# Patient Record
Sex: Male | Born: 1950 | Smoking: Former smoker
Health system: Southern US, Community
[De-identification: ages and names within clinical notes are randomized; demographics above are authoritative.]

## PROBLEM LIST (undated history)

## (undated) DIAGNOSIS — R0602 Shortness of breath: Secondary | ICD-10-CM

## (undated) DIAGNOSIS — Z720 Tobacco use: Secondary | ICD-10-CM

## (undated) DIAGNOSIS — R55 Syncope and collapse: Secondary | ICD-10-CM

## (undated) DIAGNOSIS — R0789 Other chest pain: Secondary | ICD-10-CM

## (undated) DIAGNOSIS — I421 Obstructive hypertrophic cardiomyopathy: Secondary | ICD-10-CM

## (undated) HISTORY — DX: Shortness of breath: R06.02

## (undated) HISTORY — DX: Syncope and collapse: R55

## (undated) HISTORY — DX: Obstructive hypertrophic cardiomyopathy: I42.1

## (undated) HISTORY — DX: Other chest pain: R07.89

## (undated) HISTORY — DX: Tobacco use: Z72.0

---

## 2021-06-03 ENCOUNTER — Other Ambulatory Visit (HOSPITAL_COMMUNITY): Payer: Self-pay | Admitting: Cardiovascular Disease

## 2021-06-03 DIAGNOSIS — I422 Other hypertrophic cardiomyopathy: Secondary | ICD-10-CM

## 2021-07-03 ENCOUNTER — Other Ambulatory Visit (HOSPITAL_COMMUNITY): Payer: Self-pay | Admitting: *Deleted

## 2021-07-03 ENCOUNTER — Telehealth (HOSPITAL_COMMUNITY): Payer: Self-pay | Admitting: *Deleted

## 2021-07-03 DIAGNOSIS — Z01812 Encounter for preprocedural laboratory examination: Secondary | ICD-10-CM

## 2021-07-03 NOTE — Telephone Encounter (Signed)
Reaching out to patient to offer assistance regarding upcoming cardiac imaging study; pt verbalizes understanding of appt date/time, parking situation and where to check in, and verified current allergies; name and call back number provided for further questions should they arise ? ?Gordy Clement RN Navigator Cardiac Imaging ?[facility] ?[phone] office ?[phone] cell ? ?Patient denies any claustrophobia or metal. He will obtain labs prior to his appointment. ?

## 2021-07-06 ENCOUNTER — Other Ambulatory Visit (HOSPITAL_COMMUNITY)
Admission: RE | Admit: 2021-07-06 | Discharge: 2021-07-06 | Disposition: A | Discharge status: Home / Self Care | Payer: No Typology Code available for payment source | Source: Ambulatory Visit | Attending: Cardiovascular Disease | Admitting: Cardiovascular Disease

## 2021-07-06 DIAGNOSIS — Z01812 Encounter for preprocedural laboratory examination: Secondary | ICD-10-CM | POA: Diagnosis present

## 2021-07-06 LAB — CBC
HCT: 48.2 % (ref 39.0–52.0)
Hemoglobin: 16.9 g/dL (ref 13.0–17.0)
MCH: 31.5 pg (ref 26.0–34.0)
MCHC: 35.1 g/dL (ref 30.0–36.0)
MCV: 89.8 fL (ref 80.0–100.0)
Platelets: 228 10*3/uL (ref 150–400)
RBC: 5.37 MIL/uL (ref 4.22–5.81)
RDW: 12.1 % (ref 11.5–15.5)
WBC: 9.6 10*3/uL (ref 4.0–10.5)
nRBC: 0 % (ref 0.0–0.2)

## 2021-07-08 ENCOUNTER — Other Ambulatory Visit: Payer: Self-pay

## 2021-07-08 ENCOUNTER — Ambulatory Visit (HOSPITAL_COMMUNITY)
Admission: RE | Admit: 2021-07-08 | Discharge: 2021-07-08 | Disposition: A | Discharge status: Home / Self Care | Payer: No Typology Code available for payment source | Source: Ambulatory Visit | Attending: Cardiovascular Disease | Admitting: Cardiovascular Disease

## 2021-07-08 DIAGNOSIS — I422 Other hypertrophic cardiomyopathy: Secondary | ICD-10-CM

## 2021-07-08 MED ORDER — GADOBUTROL 1 MMOL/ML IV SOLN
8.0000 mL | Freq: Once | INTRAVENOUS | Status: AC | PRN
  Administered 2021-07-08: 8 mL via INTRAVENOUS

## 2021-09-02 ENCOUNTER — Telehealth: Payer: Self-pay

## 2021-09-02 NOTE — Telephone Encounter (Signed)
NOTES SCANNED TO REFERRRAL 

## 2021-11-15 NOTE — Progress Notes (Unsigned)
Cardiology Office Note:    Date:  11/17/2021   ID:  Roy Joseph, DOB [birth year], MRN [id number]  PCP:  Clinic, [facility] Providers Cardiologist:  Christell Constant, MD     Referring MD: Ulanda Edison, MD   CC: Discussed of HCM therapies Consulted for the evaluation of HCM at the behest of Dr. Baron Hamper  History of Present Illness:    Roy Joseph is a 71 y.o. male with a hx of oHCM (MRI 06/2021, Moderate AI, Mild Aortic dilation, mitral regurgitation, septal thickness 20 mm).  Tobacco abuse. First diagnosed in [place].  Has a daughter who he stays with (he predominantly stays in his [place] Niece).  He gets care through the [place].  Father had HCM. 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    Physically he feels great. 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 Notes variable fatigue, worse in the winter. Notes no palpitations Notes no CP. Notes rare dizziness with quick standing. Notes no cardiac syncope. Notable family events include Father with HCM. Daughter is a Systems analyst, she has been told to get checked. Notes profound fatigue with metoprolol: profound hypotension.  Past Medical History:  Diagnosis Date   Chest heaviness    Near syncope    Obstructive hypertrophic cardiomyopathy (HCC)    SOB (shortness of breath)    Tobacco user     History reviewed. No pertinent surgical history.  Current Medications: Current Meds  Medication Sig   sildenafil (VIAGRA) 100 MG tablet Take 100 mg by mouth daily as needed for erectile dysfunction.     Allergies:   Patient has no known allergies.   Social History   Socioeconomic History   Marital status: Unknown    Spouse name: Not on file   Number of children: Not on file   Years of education: Not on file   Highest  education level: Not on file  Occupational History   Not on file  Tobacco Use   Smoking status: Former    Types: Cigarettes   Smokeless tobacco: Never  Substance and Sexual Activity   Alcohol use: Yes   Drug use: Not on file   Sexual activity: Yes  Other Topics Concern   Not on file  Social History Narrative   Not on file   Social Determinants of Health   Financial Resource Strain: Not on file  Food Insecurity: Not on file  Transportation Needs: Not on file  Physical Activity: Not on file  Stress: Not on file  Social Connections: Not on file     Family History: The patient's family history includes Heart Problems in his father.  ROS:   Please see the history of present illness.     All other systems reviewed and are negative.  EKGs/Labs/Other Studies Reviewed:    The following studies were reviewed today:  EKG:  EKG is  ordered today.  The ekg ordered today demonstrates  11/17/21: SR rate 67 LBBB  CMR: Date 07/08/21 Results: There is severe inferoseptal hypertrophy, 20 mm, with systolic anterior motion of the mitral valve, moderate regurgitation, and small LGE burden. This is consistent with hypertrophic cardiomyopathy.   There is mild dilation of the aorta with moderate aortic regurgitation and mild LV dilation by indexed volume.  Echo  from OSH shows MR, AI and moderate LVH.  Unclear LVOT gradient.  Recent Labs: 07/06/2021: Hemoglobin 16.9; Platelets 228  Recent Lipid Panel No results found for: "CHOL", "TRIG", "HDL", "CHOLHDL", "VLDL", "LDLCALC", "LDLDIRECT"       Physical Exam:    VS:  BP 130/78   Pulse 67   Ht 5\' 9"  (1.753 m)   Wt 175 lb (79.4 kg)   SpO2 97%   BMI 25.84 kg/m     Wt Readings from Last 3 Encounters:  11/17/21 175 lb (79.4 kg)   Gen: No distress  Neck: No JVD Ears: no 11/19/21 Sign Cardiac: No Rubs or Gallops, harsh systolic murmur with standing and handgrip, RRR + 2 radial pulses Respiratory: Clear to auscultation bilaterally,  normal effort, normal  respiratory rate GI: Soft, nontender, non-distended  MS: No  edema;  moves all extremities Integument: Skin feels warm Neuro:  At time of evaluation, alert and oriented to person/place/time/situation  Psych: Normal affect, patient feels well  ASSESSMENT:    1. HOCM (hypertrophic obstructive cardiomyopathy) (HCC)    PLAN:    Hypertrophic Cardiomyopathy - complicated by beta blocker hypotension Mild aortic ectastia Moderate aortic regurgitation and mitral regurgitation Former smoker (stopped 11/02/21) LBBB - NYHA I-II - will get stress echo for LVOT gradient, exercise ectopy, and functional status  - Family history positive in Father, Discussed family screening  (Deferred genetics and daughter needs echo)  - will need heart monitor f/u but will discus this at 11/23 visit  We have talked that do to his concomitant MR, and AI, that when he need intervention we would discuss septal myectomy, MR, and AI either here or at [facility].  We have discussed LHC and TEE if needed, I will see him in November and we will follow him with MRI for volumetric AI MR assessments.  We have reviewed the disease processes at length and reviewed his prior CMR.  Time Spent Directly with Patient:   I have spent a total of 63 minutes with the patient reviewing notes, imaging, EKGs, labs and examining the patient as well as establishing an assessment and plan that was discussed personally with the patient.  > 50% of time was spent in direct patient care  and reviewing imaging with patient and with the [facility].       Medication Adjustments/Labs and Tests Ordered: Current medicines are reviewed at length with the patient today.  Concerns regarding medicines are outlined above.  Orders Placed This Encounter  Procedures   Cardiac Stress Test: Informed Consent Details: Physician/Practitioner Attestation; Transcribe to consent form and obtain patient signature   EKG 12-Lead    ECHOCARDIOGRAM STRESS TEST   No orders of the defined types were placed in this encounter.   Patient Instructions  Medication Instructions:  Your physician recommends that you continue on your current medications as directed. Please refer to the Current Medication list given to you today.  *If you need a refill on your cardiac medications before your next appointment, please call your pharmacy*   Lab Work: NONE If you have labs (blood work) drawn today and your tests are completely normal, you will receive your results only by: MyChart Message (if you have MyChart) OR A paper copy in the mail If you have any lab test that is abnormal or we need to change your treatment, we will call you to review the results.   Testing/Procedures: Your physician has requested that you have a stress echocardiogram. For further  information please visit [email or web address] Please follow instruction sheet as given.    Follow-Up: At [facility], you and your health needs are our priority.  As part of our continuing mission to provide you with exceptional heart care, we have created designated Provider Care Teams.  These Care Teams include your primary Cardiologist (physician) and Advanced Practice Providers (APPs -  Physician Assistants and Nurse Practitioners) who all work together to provide you with the care you need, when you need it.  We recommend signing up for the patient portal called "MyChart".  Sign up information is provided on this After Visit Summary.  MyChart is used to connect with patients for Virtual Visits (Telemedicine).  Patients are able to view lab/test results, encounter notes, upcoming appointments, etc.  Non-urgent messages can be sent to your provider as well.   To learn more about what you can do with MyChart, go to [email or web address].    Your next appointment:   4 month(s)  The format for your next appointment:   In Person  Provider:   Christell Constant, MD     Important Information About Sugar         Signed, Christell Constant, MD  11/17/2021 10:03 AM    [facility]

## 2021-11-17 ENCOUNTER — Encounter: Payer: Self-pay | Admitting: Internal Medicine

## 2021-11-17 ENCOUNTER — Ambulatory Visit (INDEPENDENT_AMBULATORY_CARE_PROVIDER_SITE_OTHER): Payer: No Typology Code available for payment source | Admitting: Internal Medicine

## 2021-11-17 VITALS — BP 130/78 | HR 67 | Ht 69.0 in | Wt 175.0 lb

## 2021-11-17 DIAGNOSIS — I421 Obstructive hypertrophic cardiomyopathy: Secondary | ICD-10-CM

## 2021-11-17 NOTE — Patient Instructions (Signed)
Medication Instructions:  Your physician recommends that you continue on your current medications as directed. Please refer to the Current Medication list given to you today.  *If you need a refill on your cardiac medications before your next appointment, please call your pharmacy*   Lab Work: NONE If you have labs (blood work) drawn today and your tests are completely normal, you will receive your results only by: MyChart Message (if you have MyChart) OR A paper copy in the mail If you have any lab test that is abnormal or we need to change your treatment, we will call you to review the results.   Testing/Procedures: Your physician has requested that you have a stress echocardiogram. For further information please visit [email or web address] Please follow instruction sheet as given.    Follow-Up: At [facility], you and your health needs are our priority.  As part of our continuing mission to provide you with exceptional heart care, we have created designated Provider Care Teams.  These Care Teams include your primary Cardiologist (physician) and Advanced Practice Providers (APPs -  Physician Assistants and Nurse Practitioners) who all work together to provide you with the care you need, when you need it.  We recommend signing up for the patient portal called "MyChart".  Sign up information is provided on this After Visit Summary.  MyChart is used to connect with patients for Virtual Visits (Telemedicine).  Patients are able to view lab/test results, encounter notes, upcoming appointments, etc.  Non-urgent messages can be sent to your provider as well.   To learn more about what you can do with MyChart, go to [email or web address].    Your next appointment:   4 month(s)  The format for your next appointment:   In Person  Provider:   Christell Constant, MD    Important Information About Sugar

## 2021-11-27 ENCOUNTER — Telehealth (HOSPITAL_COMMUNITY): Payer: Self-pay | Admitting: Radiology

## 2021-11-27 NOTE — Telephone Encounter (Signed)
Patient given detailed instructions per Myocardial Perfusion Study Information Sheet for the test on 8/10 at 14:00. Patient notified to arrive 15 minutes early and that it is imperative to arrive on time for appointment to keep from having the test rescheduled.  If you need to cancel or reschedule your appointment, please call the office within 24 hours of your appointment. . Patient verbalized understanding.EHK

## 2021-12-03 ENCOUNTER — Ambulatory Visit (HOSPITAL_COMMUNITY): Payer: No Typology Code available for payment source

## 2021-12-03 ENCOUNTER — Ambulatory Visit (HOSPITAL_COMMUNITY): Payer: No Typology Code available for payment source | Attending: Internal Medicine

## 2021-12-03 DIAGNOSIS — I421 Obstructive hypertrophic cardiomyopathy: Secondary | ICD-10-CM | POA: Insufficient documentation

## 2021-12-03 LAB — ECHOCARDIOGRAM STRESS TEST
P 1/2 time: 539 msec
S' Lateral: 2.6 cm

## 2022-03-23 NOTE — Progress Notes (Unsigned)
Cardiology Office Note:    Date:  03/24/2022   ID:  Roy Joseph, DOB 06-21-1950, MRN [id number]  PCP:  Clinic, [facility] Providers Cardiologist:  Christell Constant, MD     Referring MD: [facility]   CC: f/u CMI vs medication therapy  History of Present Illness:    Roy Joseph is a 71 y.o. male with a hx of oHCM (MRI 06/2021, Moderate AI, Mild Aortic dilation, mitral regurgitation, septal thickness 20 mm).  Tobacco abuse. First diagnosed in [place].  Has a daughter who he stays with (he predominantly stays in his [place] Niece).  He gets care through the [place].  Father had HCM. He is a Occupational hygienist and and at the age of 77 (ish) had chest pressure.   2023: saw his daugther in interval; was given OK to chat with him.   Patient notes that he is been feeling well. He is heading back to [place] tomorrow. Rarely feels short of breath; DOE in the cold. Notes  fatigue when he eats. Notes no palpitations Notes no CP. Notes rare dizziness when pending over. Notes no syncope. Notable family events include daugther established with me. He has not gotten back into his [place] Sequin, he largely does this or his hiking back in [place].   Past Medical History:  Diagnosis Date   Chest heaviness    Near syncope    Obstructive hypertrophic cardiomyopathy (HCC)    SOB (shortness of breath)    Tobacco user     No past surgical history on file.  Current Medications: Current Meds  Medication Sig   sildenafil (VIAGRA) 100 MG tablet Take 100 mg by mouth daily as needed for erectile dysfunction.     Allergies:   Patient has no known allergies.   Social History   Socioeconomic History   Marital status: Unknown    Spouse name: Not on file   Number of children: Not on file   Years of education: Not on file   Highest education level: Not on file  Occupational History   Not on file  Tobacco Use   Smoking status: Former    Types:  Cigarettes   Smokeless tobacco: Never  Substance and Sexual Activity   Alcohol use: Yes   Drug use: Not on file   Sexual activity: Yes  Other Topics Concern   Not on file  Social History Narrative   Not on file   Social Determinants of Health   Financial Resource Strain: Not on file  Food Insecurity: Not on file  Transportation Needs: Not on file  Physical Activity: Not on file  Stress: Not on file  Social Connections: Not on file     Family History: The patient's family history includes Heart Problems in his father. Father had HCM and valve disease  ROS:   Please see the history of present illness.     All other systems reviewed and are negative.  EKGs/Labs/Other Studies Reviewed:    The following studies were reviewed today:  EKG:   11/17/21: SR rate 67 LBBB  Cardiac Studies & Procedures     STRESS TESTS  ECHOCARDIOGRAM STRESS TEST 01/15/2022  Narrative EXERCISE STRESS ECHO REPORT   --------------------------------------------------------------------------------  Patient Name:   Roy Joseph Date of Exam: 12/03/2021 Medical Rec #:  [id number]         Height:       69.0 in Accession #:    [id number]  Weight:       175.0 lb Date of Birth:  07/04/1950         BSA:          1.952 m Patient Age:    70 years          BP:           149/87 mmHg Patient Gender: M                 HR:           62 bpm. Exam Location:  [address]  Procedure: Stress Echo and Limited Color Doppler  Indications:    I42.1 HOCM Stress for HOCM Evaluation  History:        Patient has no prior history of Echocardiogram examinations. Near Syncope.  Sonographer:    Daphine DeutscheraTashia Rodgers-Jones RDCS Referring Phys: [id number] [facility] Oline [facility]  IMPRESSIONS   1. This is an inconclusive stress echocardiogram for ischemia. Non-diagnostic study for ischemia due to inadequate heart rate response. 2. Mild to moderate posteriorly directed AI, holds the anterior mitral leaflet open.  The AMVL is markedly thickened. 3. The findings of the study demonstrate that a stress-induced outflow tract obstruction is present (peak LVOT gradient 31 mmHG). Gradient of 15 mmHg after valsalva. Rest gradient is 4 mmHg in the LVOT 4. This is an indeterminate risk study.  FINDINGS  Exam Protocol: The patient exercised on a treadmill according to a Bruce protocol.   Patient Performance: The patient exercised for 9 minutes achieving 11.10 METS. The heart rate at peak stress was 121 bpm. The target heart rate was calculated to be 128 bpm. The percentage of maximum predicted heart rate achieved was 80.5 %. The baseline blood pressure was 149/87 mmHg. The blood pressure at peak stress was 148/65 mmHg. The blood pressure response was hypertensive. The patient developed fatigue during the stress exam.  EKG: Resting EKG showed normal sinus rhythm with left bundle branch block. The patient developed ST-T segment changes during exercise.   2D Echo Findings: The baseline ejection fraction was 70%. The peak ejection fraction at stress was 80%. Baseline regional wall motion abnormalities were not present. This is an inconclusive stress echocardiogram for ischemia. This is a non-diagnostic study for ischemia due to inadequate heart rate response.  Stress Doppler:  LVOT: The baseline LVOT gradient was 4 mmHG. The peak LVOT gradient at stress was 31 mmHG. The findings of the study demonstrate that a stress-induced outflow tract obstruction is present.   Zoila ShutterKenneth Hilty MD Electronically signed on 12/03/2021 at 10:58:37 PM      Final       CARDIAC MRI  MR CARDIAC MORPHOLOGY W WO CONTRAST 07/08/2021  Narrative CLINICAL DATA:  Clinical question of hypertrophic cardiomyopathy Study assumes HCT of 48 and BSA of 1.98 m2.  EXAM: CARDIAC MRI  TECHNIQUE: The patient was scanned on a 1.5 Tesla GE magnet. A dedicated cardiac coil was used. Functional imaging was done using Fiesta sequences. 2,3, and  4 chamber views were done to assess for RWMA's. Modified Simpson's rule using a short axis stack was used to calculate an ejection fraction on a dedicated work Research officer, trade unionstation using Circle software. The patient received 8 cc of Gadavist. After 10 minutes inversion recovery sequences were used to assess for infiltration and scar tissue.  CONTRAST:  8 cc  of Gadavist  FINDINGS: 1. Mildly increased left ventricular size, with LVEDD 55 mm, but LVEDVi 87 mL/m2.  Severe asymmetric hypertrophy, with largest thickness mid  inferior-septal at 20 mm. Myocardial mass index of 91 g/m2.  Normal left ventricular systolic function (LVEF =60%). There is systolic anterior motion of the mitral valve with LVOT obstruction and associated mitral regurgitation. There is no apical aneurysm.  Left ventricular parametric mapping notable for normal T2 and ECV.  There is a small amount of mid inferior late gadolinium enhancement in the left ventricular myocardium- < 1 % (0.39%) of myocardium involved using 6 standard deviation assessment.  2. Normal right ventricular size with RVEDVI 62 mL/m2.  Normal right ventricular thickness.  Normal right ventricular systolic function (RVEF =59%). There are no regional wall motion abnormalities or aneurysms.  3.  Normal left and right atrial size.  4.  Normal Aortic Root.  Mild dilated Sinus of Valsalva: 41 mm L-N Sinus to Sinus.  Mild dilated ascending aorta: 40 mm.  Moderately dilated main pulmonary artery 34 mm.  5. Valve assessment:  Aortic Valve: Tri-leaflet aortic valve. Moderate secondary regurgitation. Regurgitant fraction 27%. Mean Gradient < 1 mmHg.  Pulmonic Valve: No significant regurgitation. Regurgitant fraction 3%. Mean Gradient < 1 mmHg.  Tricuspid Valve: Qualitatively no significant regurgitation.  Mitral Valve: Anterior mitral valve prolapse. Moderate secondary mitral regurgitation, mixed mechanism from prolapse and systolic anterior motion  of the mitral valve. Regurgitant fraction 29%.  6.  Normal pericardium.  No pericardial effusion.  7. Grossly, no extracardiac findings. Recommended dedicated study if concerned for non-cardiac pathology.  8. Wrap around artifacts noted.  IMPRESSION: There is severe inferoseptal hypertrophy, 20 mm, with systolic anterior motion of the mitral valve, moderate regurgitation, and small LGE burden. This is consistent with hypertrophic cardiomyopathy.  There is mild dilation of the aorta with moderate aortic regurgitation and mild LV dilation by indexed volume.  Riley Lam MD   Electronically Signed By: Riley Lam M.D. On: 07/08/2021 13:17           CMR: Date 07/08/21 Results: There is severe inferoseptal hypertrophy, 20 mm, with systolic anterior motion of the mitral valve, moderate regurgitation, and small LGE burden. This is consistent with hypertrophic cardiomyopathy.   There is mild dilation of the aorta with moderate aortic regurgitation and mild LV dilation by indexed volume.  Echo from OSH shows MR, AI and moderate LVH.  Unclear LVOT gradient.  Cardiac Studies & Procedures     STRESS TESTS  ECHOCARDIOGRAM STRESS TEST 01/15/2022  Narrative EXERCISE STRESS ECHO REPORT   --------------------------------------------------------------------------------  Patient Name:   Roy Joseph Date of Exam: 12/03/2021 Medical Rec #:  [id number]         Height:       69.0 in Accession #:    [id number]        Weight:       175.0 lb Date of Birth:  01-26-1951         BSA:          1.952 m Patient Age:    70 years          BP:           149/87 mmHg Patient Gender: M                 HR:           62 bpm. Exam Location:  [address]  Procedure: Stress Echo and Limited Color Doppler  Indications:    I42.1 HOCM Stress for HOCM Evaluation  History:        Patient has no prior history of Echocardiogram examinations. Near  Syncope.  Sonographer:     Daphine Deutscher RDCS Referring Phys: [id number] [facility] A Jenel Gierke  IMPRESSIONS   1. This is an inconclusive stress echocardiogram for ischemia. Non-diagnostic study for ischemia due to inadequate heart rate response. 2. Mild to moderate posteriorly directed AI, holds the anterior mitral leaflet open. The AMVL is markedly thickened. 3. The findings of the study demonstrate that a stress-induced outflow tract obstruction is present (peak LVOT gradient 31 mmHG). Gradient of 15 mmHg after valsalva. Rest gradient is 4 mmHg in the LVOT 4. This is an indeterminate risk study.  FINDINGS  Exam Protocol: The patient exercised on a treadmill according to a Bruce protocol.   Patient Performance: The patient exercised for 9 minutes achieving 11.10 METS. The heart rate at peak stress was 121 bpm. The target heart rate was calculated to be 128 bpm. The percentage of maximum predicted heart rate achieved was 80.5 %. The baseline blood pressure was 149/87 mmHg. The blood pressure at peak stress was 148/65 mmHg. The blood pressure response was hypertensive. The patient developed fatigue during the stress exam.  EKG: Resting EKG showed normal sinus rhythm with left bundle branch block. The patient developed ST-T segment changes during exercise.   2D Echo Findings: The baseline ejection fraction was 70%. The peak ejection fraction at stress was 80%. Baseline regional wall motion abnormalities were not present. This is an inconclusive stress echocardiogram for ischemia. This is a non-diagnostic study for ischemia due to inadequate heart rate response.  Stress Doppler:  LVOT: The baseline LVOT gradient was 4 mmHG. The peak LVOT gradient at stress was 31 mmHG. The findings of the study demonstrate that a stress-induced outflow tract obstruction is present.   Zoila Shutter MD Electronically signed on 12/03/2021 at 10:58:37 PM      Final       CARDIAC MRI  MR CARDIAC MORPHOLOGY W WO CONTRAST  07/08/2021  Narrative CLINICAL DATA:  Clinical question of hypertrophic cardiomyopathy Study assumes HCT of 48 and BSA of 1.98 m2.  EXAM: CARDIAC MRI  TECHNIQUE: The patient was scanned on a 1.5 Tesla GE magnet. A dedicated cardiac coil was used. Functional imaging was done using Fiesta sequences. 2,3, and 4 chamber views were done to assess for RWMA's. Modified Simpson's rule using a short axis stack was used to calculate an ejection fraction on a dedicated work Research officer, trade union. The patient received 8 cc of Gadavist. After 10 minutes inversion recovery sequences were used to assess for infiltration and scar tissue.  CONTRAST:  8 cc  of Gadavist  FINDINGS: 1. Mildly increased left ventricular size, with LVEDD 55 mm, but LVEDVi 87 mL/m2.  Severe asymmetric hypertrophy, with largest thickness mid inferior-septal at 20 mm. Myocardial mass index of 91 g/m2.  Normal left ventricular systolic function (LVEF =60%). There is systolic anterior motion of the mitral valve with LVOT obstruction and associated mitral regurgitation. There is no apical aneurysm.  Left ventricular parametric mapping notable for normal T2 and ECV.  There is a small amount of mid inferior late gadolinium enhancement in the left ventricular myocardium- < 1 % (0.39%) of myocardium involved using 6 standard deviation assessment.  2. Normal right ventricular size with RVEDVI 62 mL/m2.  Normal right ventricular thickness.  Normal right ventricular systolic function (RVEF =59%). There are no regional wall motion abnormalities or aneurysms.  3.  Normal left and right atrial size.  4.  Normal Aortic Root.  Mild dilated Sinus of Valsalva: 41 mm L-N Sinus to  Sinus.  Mild dilated ascending aorta: 40 mm.  Moderately dilated main pulmonary artery 34 mm.  5. Valve assessment:  Aortic Valve: Tri-leaflet aortic valve. Moderate secondary regurgitation. Regurgitant fraction 27%. Mean Gradient < 1  mmHg.  Pulmonic Valve: No significant regurgitation. Regurgitant fraction 3%. Mean Gradient < 1 mmHg.  Tricuspid Valve: Qualitatively no significant regurgitation.  Mitral Valve: Anterior mitral valve prolapse. Moderate secondary mitral regurgitation, mixed mechanism from prolapse and systolic anterior motion of the mitral valve. Regurgitant fraction 29%.  6.  Normal pericardium.  No pericardial effusion.  7. Grossly, no extracardiac findings. Recommended dedicated study if concerned for non-cardiac pathology.  8. Wrap around artifacts noted.  IMPRESSION: There is severe inferoseptal hypertrophy, 20 mm, with systolic anterior motion of the mitral valve, moderate regurgitation, and small LGE burden. This is consistent with hypertrophic cardiomyopathy.  There is mild dilation of the aorta with moderate aortic regurgitation and mild LV dilation by indexed volume.  Riley Lam MD   Electronically Signed By: Riley Lam M.D. On: 07/08/2021 13:17          Recent Labs: 07/06/2021: Hemoglobin 16.9; Platelets 228  Recent Lipid Panel No results found for: "CHOL", "TRIG", "HDL", "CHOLHDL", "VLDL", "LDLCALC", "LDLDIRECT"       Physical Exam:    VS:  BP 130/74   Pulse 84   Ht  (1.753 m)   Wt 175 lb (79.4 kg)   SpO2 99%   BMI 25.84 kg/m     Wt Readings from Last 3 Encounters:  03/24/22 175 lb (79.4 kg)  11/17/21 175 lb (79.4 kg)   Gen: No distress  Neck: No JVD Ears: no Homero Fellers Sign Cardiac: No Rubs or Gallops, harsh systolic murmur with diastolic murmur, RRR + 2 radial pulses Respiratory: Clear to auscultation bilaterally, normal effort, normal  respiratory rate GI: Soft, nontender, non-distended  MS: No  edema;  moves all extremities Integument: Skin feels warm Neuro:  At time of evaluation, alert and oriented to person/place/time/situation  Psych: Normal affect, patient feels well  ASSESSMENT:    1. Hypertrophic cardiomyopathy (HCC)    2. Nonrheumatic aortic valve insufficiency     PLAN:    Hypertrophic Cardiomyopathy - complicated by beta blocker hypotension Mild aortic ectastia Moderate aortic regurgitation and mitral regurgitation Former smoker  LBBB - NYHA II - LVOT gradient of 30 mm Hg  - Family history positive in Father, Discussed family screening; daugther is getting pending  SCD risk slightly greater than 1.36% (has LGE on CMR), ICD is not recommended; reviewed risks with patient; will get zio in 2024  He has deferred medical therapy because he is rarely symptoms - discussed lifestyle changes and hydration - he will call in if he is still symptomatic with this; we will try verapamil 120 mg PO daily - if NYHA sx will consider SRT - CMR in April for AI; he knows that he will likely, eventually need SRT and AVR (here, [facility], or at [facility])  Time Spent Directly with Patient:   I have spent a total of 40 minutes  minutes with the patient reviewing notes, imaging, EKGs, labs and examining the patient as well as establishing an assessment and plan that was discussed personally with the patient.  > 50% of time was spent in direct patient care  and reviewing imaging with patient.    Please send results to [facility] team  Exercise Prescription  Frequency: three days per week to start  Intensity:  60 to 90 percent of  heart rate reserve. Time:  20 minutes per session with 5 min increase per session Type: Walking, Tae Kwon Do (No spar- forms) Volume:  goal of 60 minute sessions Step goal:  7000 per week with goal to increase by 500 each weak Progression:  gradually increase the intensity or duration of exercise. Limitations: No sparing         Medication Adjustments/Labs and Tests Ordered: Current medicines are reviewed at length with the patient today.  Concerns regarding medicines are outlined above.  Orders Placed This Encounter  Procedures   MR CARDIAC MORPHOLOGY W WO CONTRAST   CBC   Basic metabolic  panel   No orders of the defined types were placed in this encounter.   Patient Instructions  Medication Instructions:  Your physician recommends that you continue on your current medications as directed. Please refer to the Current Medication list given to you today.  *If you need a refill on your cardiac medications before your next appointment, please call your pharmacy*   Lab Work: CBC,BMET (in march 2024) If you have labs (blood work) drawn today and your tests are completely normal, you will receive your results only by: MyChart Message (if you have MyChart) OR A paper copy in the mail If you have any lab test that is abnormal or we need to change your treatment, we will call you to review the results.   Testing/Procedures: Cardiac MRI   Follow-Up: At [facility], you and your health needs are our priority.  As part of our continuing mission to provide you with exceptional heart care, we have created designated Provider Care Teams.  These Care Teams include your primary Cardiologist (physician) and Advanced Practice Providers (APPs -  Physician Assistants and Nurse Practitioners) who all work together to provide you with the care you need, when you need it.  We recommend signing up for the patient portal called "MyChart".  Sign up information is provided on this After Visit Summary.  MyChart is used to connect with patients for Virtual Visits (Telemedicine).  Patients are able to view lab/test results, encounter notes, upcoming appointments, etc.  Non-urgent messages can be sent to your provider as well.   To learn more about what you can do with MyChart, go to [email or web address].    Your next appointment:   8 month(s)  The format for your next appointment:   In Person  Provider:   Christell Constant, MD       Important Information About Sugar         Signed, Christell Constant, MD  03/24/2022 9:32 AM    [facility]

## 2022-03-24 ENCOUNTER — Encounter: Payer: Self-pay | Admitting: Internal Medicine

## 2022-03-24 ENCOUNTER — Ambulatory Visit: Payer: No Typology Code available for payment source | Attending: Internal Medicine | Admitting: Internal Medicine

## 2022-03-24 VITALS — BP 130/74 | HR 84 | Ht 69.0 in | Wt 175.0 lb

## 2022-03-24 DIAGNOSIS — I422 Other hypertrophic cardiomyopathy: Secondary | ICD-10-CM | POA: Insufficient documentation

## 2022-03-24 DIAGNOSIS — I351 Nonrheumatic aortic (valve) insufficiency: Secondary | ICD-10-CM | POA: Insufficient documentation

## 2022-03-24 NOTE — Patient Instructions (Signed)
Medication Instructions:  Your physician recommends that you continue on your current medications as directed. Please refer to the Current Medication list given to you today.  *If you need a refill on your cardiac medications before your next appointment, please call your pharmacy*   Lab Work: CBC,BMET (in march 2024) If you have labs (blood work) drawn today and your tests are completely normal, you will receive your results only by: MyChart Message (if you have MyChart) OR A paper copy in the mail If you have any lab test that is abnormal or we need to change your treatment, we will call you to review the results.   Testing/Procedures: Cardiac MRI   Follow-Up: At [facility], you and your health needs are our priority.  As part of our continuing mission to provide you with exceptional heart care, we have created designated Provider Care Teams.  These Care Teams include your primary Cardiologist (physician) and Advanced Practice Providers (APPs -  Physician Assistants and Nurse Practitioners) who all work together to provide you with the care you need, when you need it.  We recommend signing up for the patient portal called "MyChart".  Sign up information is provided on this After Visit Summary.  MyChart is used to connect with patients for Virtual Visits (Telemedicine).  Patients are able to view lab/test results, encounter notes, upcoming appointments, etc.  Non-urgent messages can be sent to your provider as well.   To learn more about what you can do with MyChart, go to [email or web address].    Your next appointment:   8 month(s)  The format for your next appointment:   In Person  Provider:   Christell Constant, MD       Important Information About Sugar

## 2022-07-23 ENCOUNTER — Telehealth (HOSPITAL_COMMUNITY): Payer: Self-pay | Admitting: Emergency Medicine

## 2022-07-23 NOTE — Telephone Encounter (Signed)
Reaching out to patient to offer assistance regarding upcoming cardiac imaging study; pt verbalizes understanding of appt date/time, parking situation and where to check in, pre-test NPO status and medications ordered, and verified current allergies; name and call back number provided for further questions should they arise Roy Bond RN Navigator Cardiac Imaging [facility] [phone] office [phone] cell  Arrival 7 [facility] radiology Denies metal Denies claustro Denies IV issues

## 2022-07-27 ENCOUNTER — Ambulatory Visit (HOSPITAL_COMMUNITY): Payer: No Typology Code available for payment source

## 2022-10-15 ENCOUNTER — Ambulatory Visit (HOSPITAL_COMMUNITY): Payer: No Typology Code available for payment source

## 2022-10-15 ENCOUNTER — Encounter (HOSPITAL_COMMUNITY): Payer: Self-pay

## 2023-09-01 IMAGING — MR MR CARD MORPHOLOGY WO/W CM
45 of 48 series · 45 of 48 positions shown · IV contrast (POST CONTRAST)
Comparison: none

CLINICAL DATA: Clinical question of hypertrophic cardiomyopathy
Study assumes HCT of 48 and BSA of 1.98 m2.

EXAM:
CARDIAC MRI
TECHNIQUE: The patient was scanned on a 1.5 Tesla GE magnet. A dedicated
cardiac coil was used. Functional imaging was done using [employer]
sequences. [DATE], and 4 chamber views were done to assess for RWMA's.
Modified Iskra rule using a short axis stack was used to
calculate an ejection fraction on a dedicated work station using
[employer] software. The patient received 8 cc of Gadavist. After 10
minutes inversion recovery sequences were used to assess for
infiltration and scar tissue.
CONTRAST:  8 cc  of Gadavist

[Series 5: [id number]_bh · axial · 8.0mm · 1.41mm/px · 1 of 16 slices shown]
[im 1/16]
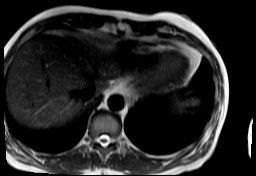

[Series 9: bSSFP · oblique · 8.0mm · 1.61mm/px · 1 of 20 slices shown (1 of 21)]
[im 1/20]
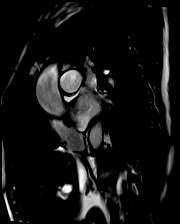

[Series 10: bSSFP · oblique · 8.0mm · 1.61mm/px · 1 of 25 slices shown (2 of 21)]
[im 1/25]
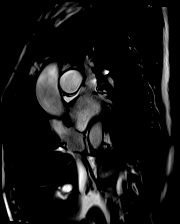

[Series 11: bSSFP · oblique · 8.0mm · 1.61mm/px · 1 of 25 slices shown (3 of 21)]
[im 1/25]
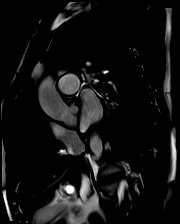

[Series 12: bSSFP · oblique · 8.0mm · 1.61mm/px · 1 of 25 slices shown (4 of 21)]
[im 1/25]
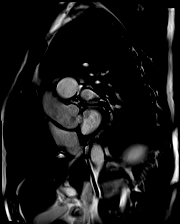

[Series 13: bSSFP · oblique · 8.0mm · 1.61mm/px · 1 of 25 slices shown (5 of 21)]
[im 1/25]
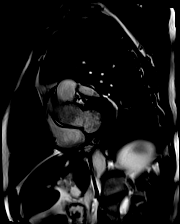

[Series 14: bSSFP · oblique · 8.0mm · 1.61mm/px · 1 of 25 slices shown (6 of 21)]
[im 1/25]
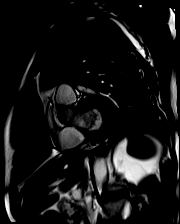

[Series 15: bSSFP · oblique · 8.0mm · 1.61mm/px · 1 of 25 slices shown (7 of 21)]
[im 1/25]
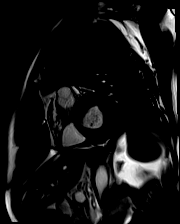

[Series 16: bSSFP · oblique · 8.0mm · 1.61mm/px · 1 of 25 slices shown (8 of 21)]
[im 1/25]
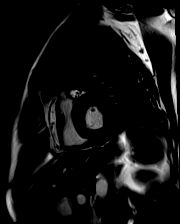

[Series 17: bSSFP · oblique · 8.0mm · 1.61mm/px · 1 of 25 slices shown (9 of 21)]
[im 1/25]
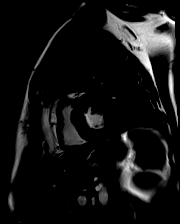

[Series 18: bSSFP · oblique · 8.0mm · 1.61mm/px · 1 of 25 slices shown (10 of 21)]
[im 1/25]
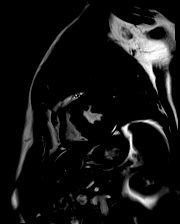

[Series 19: bSSFP · oblique · 8.0mm · 1.61mm/px · 1 of 25 slices shown (11 of 21)]
[im 1/25]
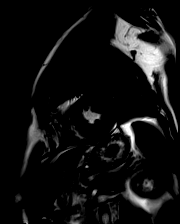

[Series 20: bSSFP · oblique · 8.0mm · 1.61mm/px · 1 of 25 slices shown (12 of 21)]
[im 1/25]
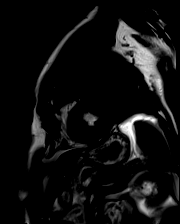

[Series 21: bSSFP · oblique · 8.0mm · 1.61mm/px · 1 of 25 slices shown (13 of 21)]
[im 1/25]
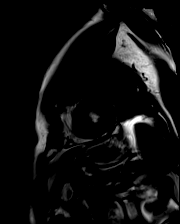

[Series 22: bSSFP · oblique · 8.0mm · 1.61mm/px · 1 of 25 slices shown (14 of 21)]
[im 1/25]
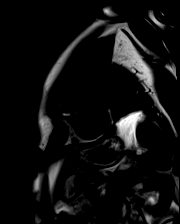

[Series 23: bSSFP · oblique · 8.0mm · 1.61mm/px · 1 of 25 slices shown (15 of 21)]
[im 1/25]
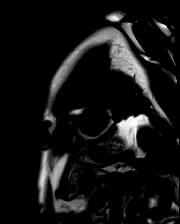

[Series 24: bSSFP · oblique · 8.0mm · 1.61mm/px · 1 of 25 slices shown (16 of 21)]
[im 1/25]
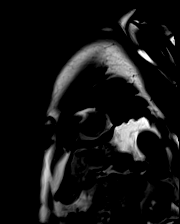

[Series 25: bSSFP · oblique · 8.0mm · 1.61mm/px · 1 of 25 slices shown (17 of 21)]
[im 1/25]
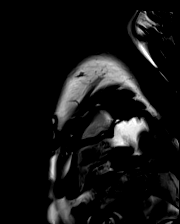

[Series 26: bSSFP · oblique · 8.0mm · 1.61mm/px · 1 of 25 slices shown (18 of 21)]
[im 1/25]
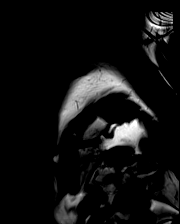

[Series 27: bSSFP · oblique · 6.0mm · 1.41mm/px · 1 of 25 slices shown (19 of 21)]
[im 1/25]
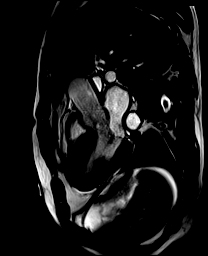

[Series 28: bSSFP · oblique · 6.0mm · 1.41mm/px · 1 of 25 slices shown (20 of 21)]
[im 1/25]
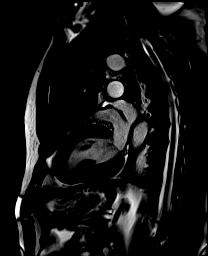

[Series 29: bSSFP · axial · 6.0mm · 1.41mm/px · 1 of 25 slices shown (21 of 21)]
[im 1/25]
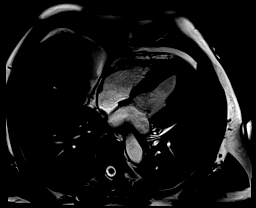

[Series 30: (id)_long_t1 · oblique · 8.0mm · 1.56mm/px · 1 of 24 slices shown]
[im 1/24]
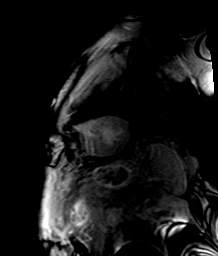

[Series 31: (id)_long_t1_moco · oblique · 8.0mm · 1.56mm/px · 1 of 24 slices shown]
[im 1/24]
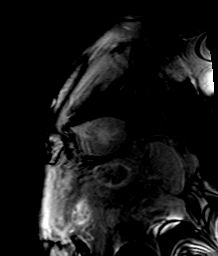

[Series 34: (id)_trufi · oblique · 8.0mm · 2.08mm/px · 1 of 9 slices shown]
[im 1/9]
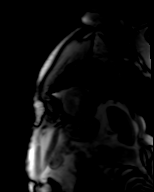

[Series 35: (id)_trufi_moco · oblique · 8.0mm · 2.08mm/px · 1 of 9 slices shown]
[im 1/9]
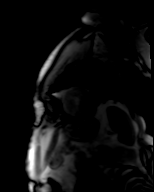

[Series 38: cine_trufi_cs_rt_short axis · oblique · 8.0mm · 1.73mm/px · 1 of 16 slices shown (1 of 18)]
[im 1/16]
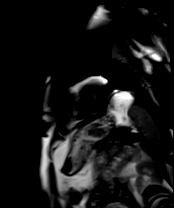

[Series 38: cine_trufi_cs_rt_short axis · oblique · 8.0mm · 1.73mm/px · 1 of 16 slices shown (2 of 18)]
[im 1/16]
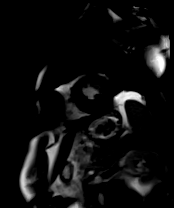

[Series 38: cine_trufi_cs_rt_short axis · oblique · 8.0mm · 1.73mm/px · 1 of 16 slices shown (3 of 18)]
[im 1/16]
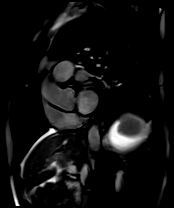

[Series 38: cine_trufi_cs_rt_short axis · oblique · 8.0mm · 1.73mm/px · 1 of 16 slices shown (4 of 18)]
[im 1/16]
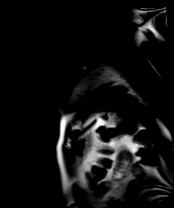

[Series 38: cine_trufi_cs_rt_short axis · oblique · 8.0mm · 1.73mm/px · 1 of 16 slices shown (5 of 18)]
[im 1/16]
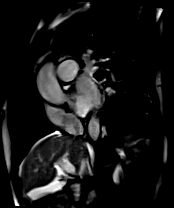

[Series 38: cine_trufi_cs_rt_short axis · oblique · 8.0mm · 1.73mm/px · 1 of 16 slices shown (6 of 18)]
[im 1/16]
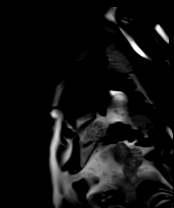

[Series 38: cine_trufi_cs_rt_short axis · oblique · 8.0mm · 1.73mm/px · 1 of 16 slices shown (7 of 18)]
[im 1/16]
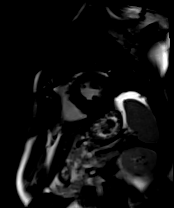

[Series 38: cine_trufi_cs_rt_short axis · oblique · 8.0mm · 1.73mm/px · 1 of 16 slices shown (8 of 18)]
[im 1/16]
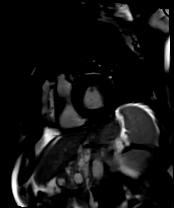

[Series 38: cine_trufi_cs_rt_short axis · oblique · 8.0mm · 1.73mm/px · 1 of 16 slices shown (9 of 18)]
[im 1/16]
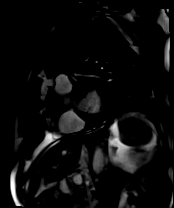

[Series 38: cine_trufi_cs_rt_short axis · oblique · 8.0mm · 1.73mm/px · 1 of 16 slices shown (10 of 18)]
[im 1/16]
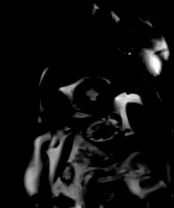

[Series 38: cine_trufi_cs_rt_short axis · oblique · 8.0mm · 1.73mm/px · 1 of 16 slices shown (11 of 18)]
[im 1/16]
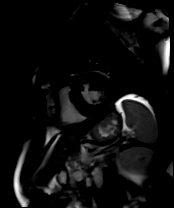

[Series 38: cine_trufi_cs_rt_short axis · oblique · 8.0mm · 1.73mm/px · 1 of 16 slices shown (12 of 18)]
[im 1/16]
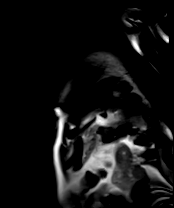

[Series 38: cine_trufi_cs_rt_short axis · oblique · 8.0mm · 1.73mm/px · 1 of 16 slices shown (13 of 18)]
[im 1/16]
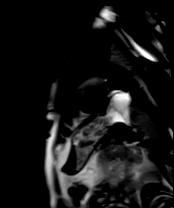

[Series 38: cine_trufi_cs_rt_short axis · oblique · 8.0mm · 1.73mm/px · 1 of 16 slices shown (14 of 18)]
[im 1/16]
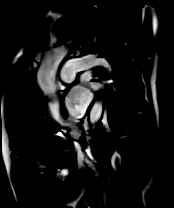

[Series 38: cine_trufi_cs_rt_short axis · oblique · 8.0mm · 1.73mm/px · 1 of 16 slices shown (15 of 18)]
[im 1/16]
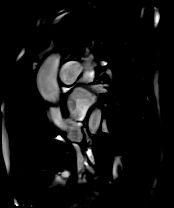

[Series 38: cine_trufi_cs_rt_short axis · oblique · 8.0mm · 1.73mm/px · 1 of 16 slices shown (16 of 18)]
[im 1/16]
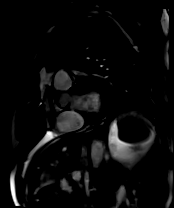

[Series 38: cine_trufi_cs_rt_short axis · oblique · 8.0mm · 1.73mm/px · 1 of 16 slices shown (17 of 18)]
[im 1/16]
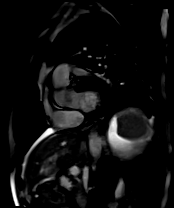

[Series 38: cine_trufi_cs_rt_short axis · oblique · 8.0mm · 1.73mm/px · 1 of 16 slices shown (18 of 18)]
[im 1/16]
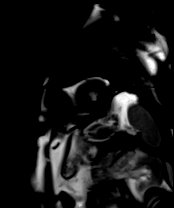

[Series 39: cine_trufi_short axis_cs_2_shot · oblique · 8.0mm · 1.48mm/px · 1 of 25 slices shown]
[im 1/25]
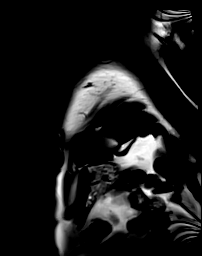

[45 of 48 positions shown; findings below may reference images not displayed]

FINDINGS: 1. Mildly increased left ventricular size, with LVEDD 55 mm, but
LVEDVi 87 mL/m2.

Severe asymmetric hypertrophy, with largest thickness mid
inferior-septal at 20 mm. Myocardial mass index of 91 g/m2.

Normal left ventricular systolic function (LVEF =60%). There is
systolic anterior motion of the mitral valve with LVOT obstruction
and associated mitral regurgitation. There is no apical aneurysm.

Left ventricular parametric mapping notable for normal T2 and ECV.

There is a small amount of mid inferior late gadolinium enhancement
in the left ventricular myocardium- < 1 % (0.39%) of myocardium
involved using 6 standard deviation assessment.

2. Normal right ventricular size with RVEDVI 62 mL/m2.

Normal right ventricular thickness.

Normal right ventricular systolic function (RVEF =59%). There are no
regional wall motion abnormalities or aneurysms.

3.  Normal left and right atrial size.

4.  Normal Aortic Root.

Mild dilated Sinus of Valsalva: 41 mm L-N Sinus to Sinus.

Mild dilated ascending aorta: 40 mm.

Moderately dilated main pulmonary artery 34 mm.

5. Valve assessment:

Aortic Valve: Tri-leaflet aortic valve. Moderate secondary
regurgitation. Regurgitant fraction 27%. Mean Gradient < 1 mmHg.

Pulmonic Valve: No significant regurgitation. Regurgitant fraction
3%. Mean Gradient < 1 mmHg.

Tricuspid Valve: Qualitatively no significant regurgitation.

Mitral Valve: Anterior mitral valve prolapse. Moderate secondary
mitral regurgitation, mixed mechanism from prolapse and systolic
anterior motion of the mitral valve. Regurgitant fraction 29%.

6.  Normal pericardium.  No pericardial effusion.

7. Grossly, no extracardiac findings. Recommended dedicated study if
concerned for non-cardiac pathology.

8. Wrap around artifacts noted.
IMPRESSION: There is severe inferoseptal hypertrophy, 20 mm, with systolic
anterior motion of the mitral valve, moderate regurgitation, and
small LGE burden. This is consistent with hypertrophic
cardiomyopathy.

There is mild dilation of the aorta with moderate aortic
regurgitation and mild LV dilation by indexed volume.
